# Patient Record
Sex: Male | Born: 1975 | Race: White | Hispanic: No | Marital: Married | Smoking: Current every day smoker
Health system: Southern US, Community
[De-identification: ages and names within clinical notes are randomized; demographics above are authoritative.]

---

## 2016-09-05 ENCOUNTER — Encounter (HOSPITAL_COMMUNITY): Payer: Self-pay | Admitting: Emergency Medicine

## 2016-09-05 ENCOUNTER — Emergency Department (HOSPITAL_COMMUNITY): Payer: 59

## 2016-09-05 ENCOUNTER — Emergency Department (HOSPITAL_COMMUNITY)
Admission: EM | Admit: 2016-09-05 | Discharge: 2016-09-05 | Disposition: A | Discharge status: Home / Self Care | Payer: 59 | Attending: Emergency Medicine | Admitting: Emergency Medicine

## 2016-09-05 DIAGNOSIS — F17210 Nicotine dependence, cigarettes, uncomplicated: Secondary | ICD-10-CM | POA: Insufficient documentation

## 2016-09-05 DIAGNOSIS — I10 Essential (primary) hypertension: Secondary | ICD-10-CM | POA: Diagnosis not present

## 2016-09-05 DIAGNOSIS — R51 Headache: Secondary | ICD-10-CM | POA: Diagnosis present

## 2016-09-05 DIAGNOSIS — B349 Viral infection, unspecified: Secondary | ICD-10-CM | POA: Diagnosis not present

## 2016-09-05 DIAGNOSIS — R079 Chest pain, unspecified: Secondary | ICD-10-CM

## 2016-09-05 DIAGNOSIS — R0789 Other chest pain: Secondary | ICD-10-CM

## 2016-09-05 LAB — BASIC METABOLIC PANEL
Anion gap: 7 (ref 5–15)
BUN: 9 mg/dL (ref 6–20)
CALCIUM: 9.5 mg/dL (ref 8.9–10.3)
CHLORIDE: 101 mmol/L (ref 101–111)
CO2: 30 mmol/L (ref 22–32)
CREATININE: 1.08 mg/dL (ref 0.61–1.24)
GFR calc non Af Amer: >60 mL/min (ref >60)
GLUCOSE: 93 mg/dL (ref 65–99)
Potassium: 3.8 mmol/L (ref 3.5–5.1)
Sodium: 138 mmol/L (ref 135–145)

## 2016-09-05 LAB — CBC
HCT: 50.6 % (ref 39.0–52.0)
Hemoglobin: 17.7 g/dL — ABNORMAL HIGH (ref 13.0–17.0)
MCH: 33.3 pg (ref 26.0–34.0)
MCHC: 35 g/dL (ref 30.0–36.0)
MCV: 95.3 fL (ref 78.0–100.0)
PLATELETS: 185 10*3/uL (ref 150–400)
RBC: 5.31 MIL/uL (ref 4.22–5.81)
RDW: 12.7 % (ref 11.5–15.5)
WBC: 5.9 10*3/uL (ref 4.0–10.5)

## 2016-09-05 LAB — TROPONIN I: Troponin I: <0.03 ng/mL (ref <0.03)

## 2016-09-05 MED ORDER — DILTIAZEM HCL ER 120 MG PO CP12: 120.0000 mg | ORAL_CAPSULE | Freq: Two times a day (BID) | ORAL | 1 refills | Status: AC

## 2016-09-05 MED ORDER — HYDROCHLOROTHIAZIDE 12.5 MG PO CAPS: 12.5000 mg | ORAL_CAPSULE | Freq: Every day | ORAL | 1 refills | Status: AC

## 2016-09-05 NOTE — Discharge Instructions (Signed)
Your blood pressure is elevated. Your blood pressure has been elevated during the emergency department visit. Please start Cardizem 1 daily, and hydrochlorothiazide 1 daily for now. These medications may be changed when you're seen by Dr. Reuel Boomaniel, or physicians at the adult medicine clinic. Your electrocardiogram and your troponin tests were negative for heart related emergencies. Please speak with Dr. Reuel Boomaniel about follow-up in the office, also with cardiology. You seem to have some symptoms possibly related to an upper respiratory infection. Please wash hands frequently. Please increase fluids. Please return to the emergency department if any emergent changes or problems before you're seen by Dr. Reuel Boomaniel, or the physicians at the adult medicine clinic.

## 2016-09-05 NOTE — ED Triage Notes (Signed)
Pt reports cp in L chest rad to L arm x1 month, went to drugstore last night to check bp and it was 186/123, has no hx of htn.

## 2016-09-05 NOTE — ED Provider Notes (Signed)
[facility] Provider Note   CSN: [id number] Arrival date & time: 09/05/16  1106     History   Chief Complaint Chief Complaint  Patient presents with  . Chest Pain  . Hypertension    HPI Capri Raben is a 41 y.o. male.  Patient is a 41 year old male who presents to the emergency department with a complaint of chest pain, dizziness, and headaches.  The patient states that he is really not been feeling well for 6 months or more. He states from time to time he's been having episodes of dizziness that he'll as though the room is spinning. These last about 2-3 seconds and go away, but they usually come back. The patient describes the headache that also comes and goes. The headache is not debilitating. The patient describes a chest pain that sometime goes into his left arm and wrist. He complains of some back area pain that sometimes keeps him from getting in and out of bed on. 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 The patient denies any fever, or chills. During the time of the chest discomfort he is not having sweats, and he is not having episodes of vomiting. He's not had any loss of consciousness. He states sometimes that when he does strenuous activities he feels short of breath. He states that he just does not feel like he should at age 85. It is of note that he is a smoker. He has not had any hemoptysis or any unusual weight loss. The patient has not taken any medication for these symptoms recently.   The history is provided by the patient.  Chest Pain   Associated symptoms include back pain. Pertinent negatives include no abdominal pain, no cough, no diaphoresis, no dizziness, no fever, no palpitations, no shortness of breath and no vomiting.  His past medical history is significant for hypertension.  Pertinent negatives for past medical history include no seizures.  Hypertension  Associated symptoms include chest pain.  Pertinent negatives include no abdominal pain and no shortness of breath.    History reviewed. No pertinent past medical history.  There are no active problems to display for this patient.   History reviewed. No pertinent surgical history.     Home Medications    Prior to Admission medications   Not on File    Family History History reviewed. No pertinent family history.  Social History Social History  Substance Use Topics  . Smoking status: Current Every Day Smoker    Packs/day: 2.00    Types: Cigarettes  . Smokeless tobacco: Not on file  . Alcohol use No     Allergies   Patient has no known allergies.   Review of Systems Review of Systems  Constitutional: Positive for fatigue. Negative for activity change, chills, diaphoresis and fever.       All ROS Neg except as noted in HPI  HENT: Positive for congestion. Negative for nosebleeds.   Eyes: Negative for photophobia and discharge.  Respiratory: Negative for cough, shortness of breath and wheezing.   Cardiovascular: Positive for chest pain. Negative for palpitations and leg swelling.  Gastrointestinal: Negative for abdominal pain, blood in stool, diarrhea and vomiting.  Genitourinary: Negative for dysuria, frequency and hematuria.  Musculoskeletal: Positive for back pain. Negative for arthralgias and neck pain.  Skin: Negative.   Neurological: Negative for dizziness, seizures and speech difficulty.  Psychiatric/Behavioral: Negative for confusion and hallucinations.  Physical Exam Updated Vital Signs BP (!) 158/112   Pulse 86   Temp 97.8 F (36.6 C) (Oral)   Resp 20   Ht 5\' 9"  (1.753 m)   Wt 90.7 kg   SpO2 97%   BMI 29.53 kg/m   Physical Exam  Constitutional: He is oriented to person, place, and time. He appears well-developed and well-nourished.  Non-toxic appearance.  HENT:  Head: Normocephalic.  Right Ear: Tympanic membrane and external ear normal.  Left Ear: Tympanic membrane and external  ear normal.  Nasal congestion present.  Eyes: EOM and lids are normal. Pupils are equal, round, and reactive to light.  Neck: Normal range of motion. Neck supple. Carotid bruit is not present.  No carotid bruits noted.  Cardiovascular: Normal rate, regular rhythm, normal heart sounds, intact distal pulses and normal pulses.   Pulmonary/Chest: Breath sounds normal. No respiratory distress.  Abdominal: Soft. Bowel sounds are normal. There is no tenderness. There is no guarding.  Musculoskeletal: Normal range of motion. He exhibits tenderness.       Lumbar back: He exhibits tenderness.  Lumbar pain with change of position and attempted ROM.  Lymphadenopathy:       Head (right side): No submandibular adenopathy present.       Head (left side): No submandibular adenopathy present.    He has no cervical adenopathy.  Neurological: He is alert and oriented to person, place, and time. He has normal strength. No cranial nerve deficit or sensory deficit.  Skin: Skin is warm and dry.  Psychiatric: He has a normal mood and affect. His speech is normal.  Nursing note and vitals reviewed.    ED Treatments / Results  Labs (all labs ordered are listed, but only abnormal results are displayed) Labs Reviewed  CBC - Abnormal; Notable for the following:       Result Value   Hemoglobin 17.7 (*)    All other components within normal limits  BASIC METABOLIC PANEL  TROPONIN I    EKG  EKG Interpretation  Date/Time:  Thursday September 05 2016 11:19:15 EST Ventricular Rate:  83 PR Interval:    QRS Duration: 90 QT Interval:  366 QTC Calculation: 430 R Axis:   68 Text Interpretation:  Sinus rhythm Confirmed by Adriana SimasOOK  MD, BRIAN ([id number]) on 09/05/2016 1:01:27 PM       Radiology Dg Chest 2 View  Result Date: 09/05/2016 CLINICAL DATA:  Chest pain . EXAM: CHEST  2 VIEW COMPARISON:  No recent prior . FINDINGS: Mediastinum and hilar structures normal. Heart size normal. No focal infiltrate. No pleural  effusion or pneumothorax. Thoracic spine scoliosis. IMPRESSION: No acute cardiopulmonary disease. Electronically Signed   By: Maisie Fushomas  Register   On: 09/05/2016 11:59    Procedures Procedures (including critical care time)  Medications Ordered in ED Medications - No data to display   Initial Impression / Assessment and Plan / ED Course  I have reviewed the triage vital signs and the nursing notes.  Pertinent labs & imaging results that were available during my care of the patient were reviewed by me and considered in my medical decision making (see chart for details).     **I have reviewed nursing notes, vital signs, and all appropriate lab and imaging results for this patient.*  Final Clinical Impressions(s) / ED Diagnoses  MDM Blood pressure is elevated, otherwise vital signs within normal limits. Patient complains of dizziness, chest pain, headache, and at times shortness of breath. He complains of fatigue and just  generally not feeling himself.  Basic metabolic panel was within normal limits. In particular the renal function is within normal limits. The anion gap is normal at 7. The complete blood count also returns normal. Troponin is negative for acute event. There no life-threatening arrhythmias or events noted on the electrocardiogram. Chest x-ray shows no acute cardiopulmonary disease.  Pt will be placed on cardizem 120 and HCTZ 12.5. Pt to have close out patient follow up. Discussed the dangers of untreated hypertension with the patient. He is in agreement with plans for follow up and medications.   Final diagnoses:  Essential hypertension  Viral illness  Chest pain of uncertain etiology    New Prescriptions Discharge Medication List as of 09/05/2016  2:08 PM    START taking these medications   Details  diltiazem (CARDIZEM SR) 120 MG 12 hr capsule Take 1 capsule (120 mg total) by mouth 2 (two) times daily., Starting Thu 09/05/2016, Print    hydrochlorothiazide  (MICROZIDE) 12.5 MG capsule Take 1 capsule (12.5 mg total) by mouth daily., Starting Thu 09/05/2016, Print         [place], PA-C 09/07/16 1610    Donnetta Hutching, MD 09/09/16 [phone]

## 2017-09-09 DIAGNOSIS — N529 Male erectile dysfunction, unspecified: Secondary | ICD-10-CM | POA: Diagnosis not present

## 2017-09-09 DIAGNOSIS — K219 Gastro-esophageal reflux disease without esophagitis: Secondary | ICD-10-CM | POA: Diagnosis not present

## 2017-09-09 DIAGNOSIS — Z720 Tobacco use: Secondary | ICD-10-CM | POA: Diagnosis not present

## 2017-09-09 DIAGNOSIS — I10 Essential (primary) hypertension: Secondary | ICD-10-CM | POA: Diagnosis not present

## 2018-06-16 IMAGING — DX DG CHEST 2V
2 series · 2 of 2 positions shown · non-contrast
Comparison: No recent prior .

CLINICAL DATA: Chest pain .

EXAM:
CHEST  2 VIEW

[chest pa]
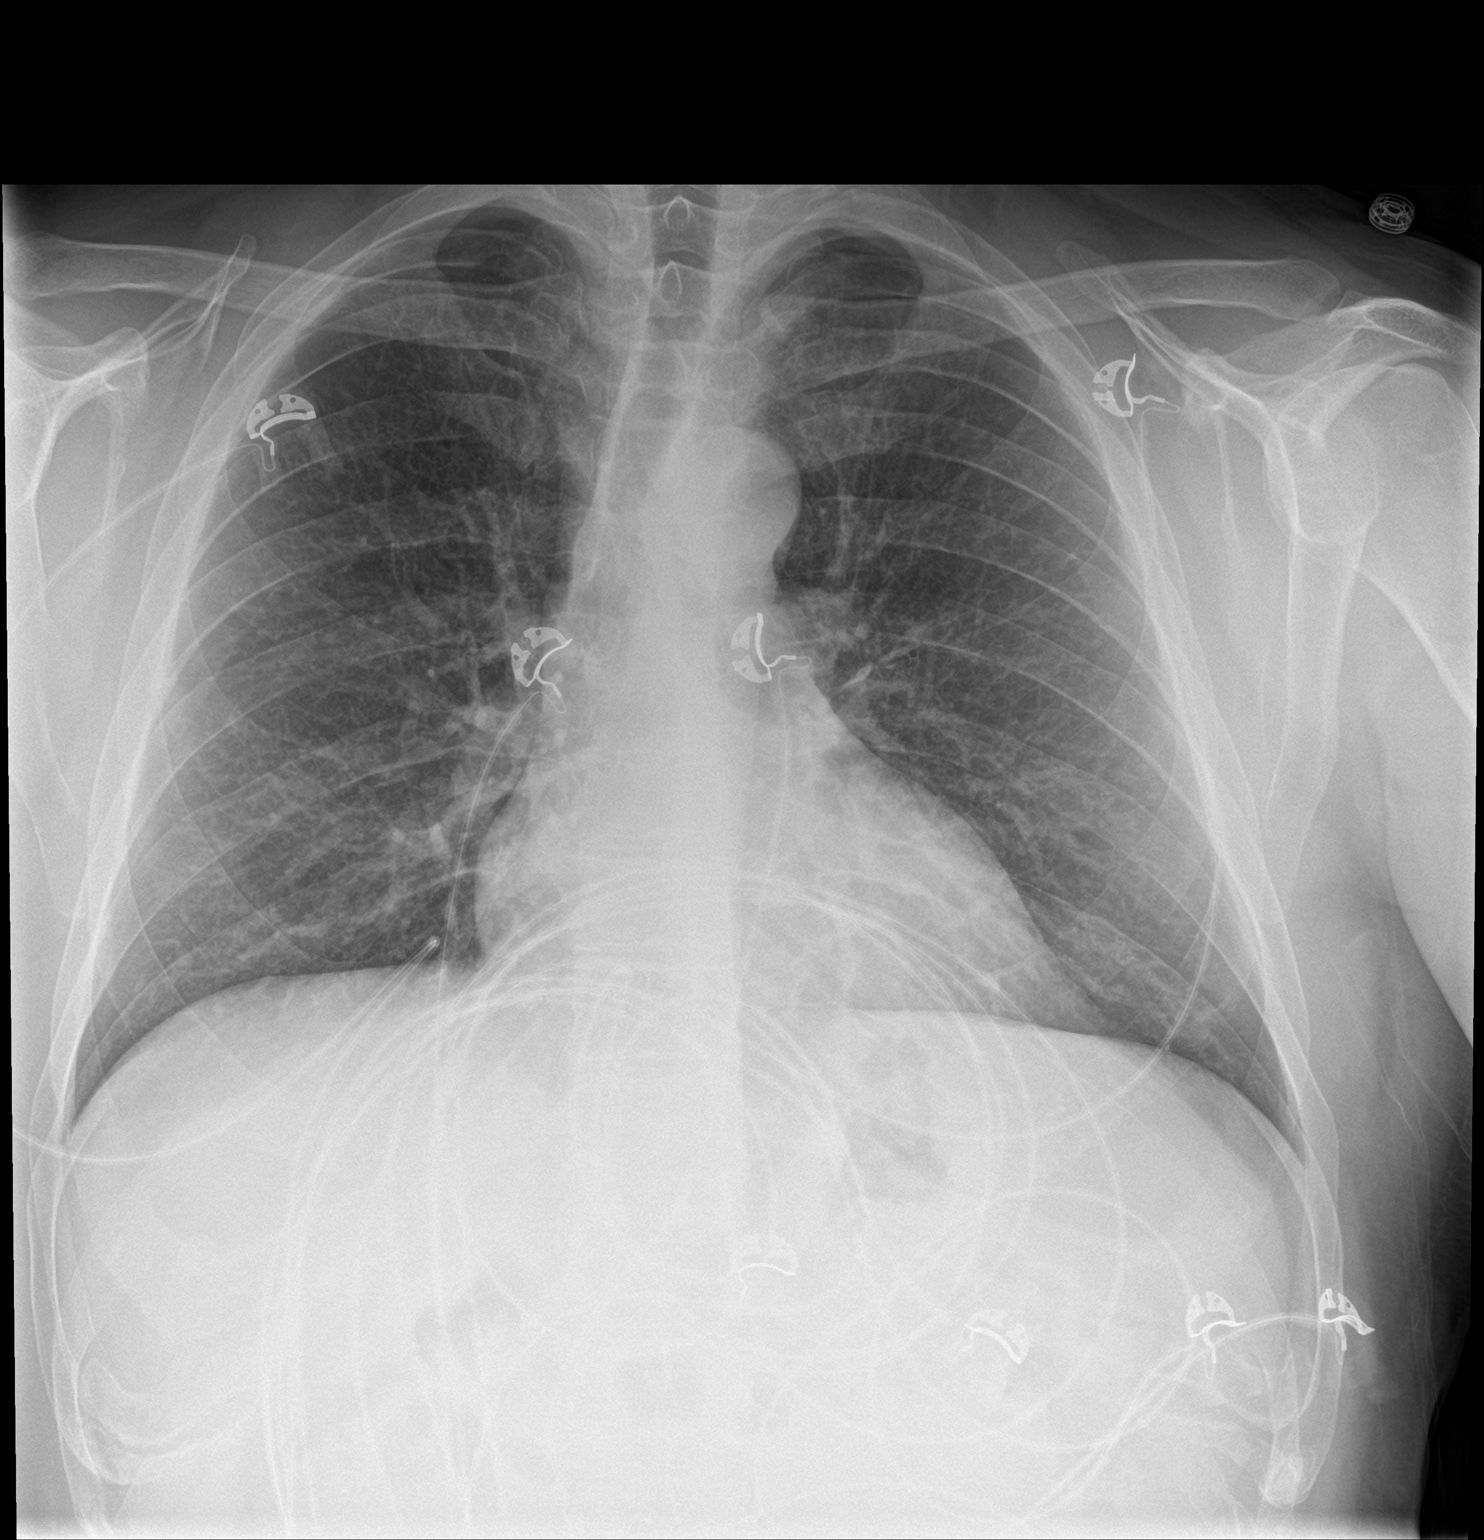

[chest lat]
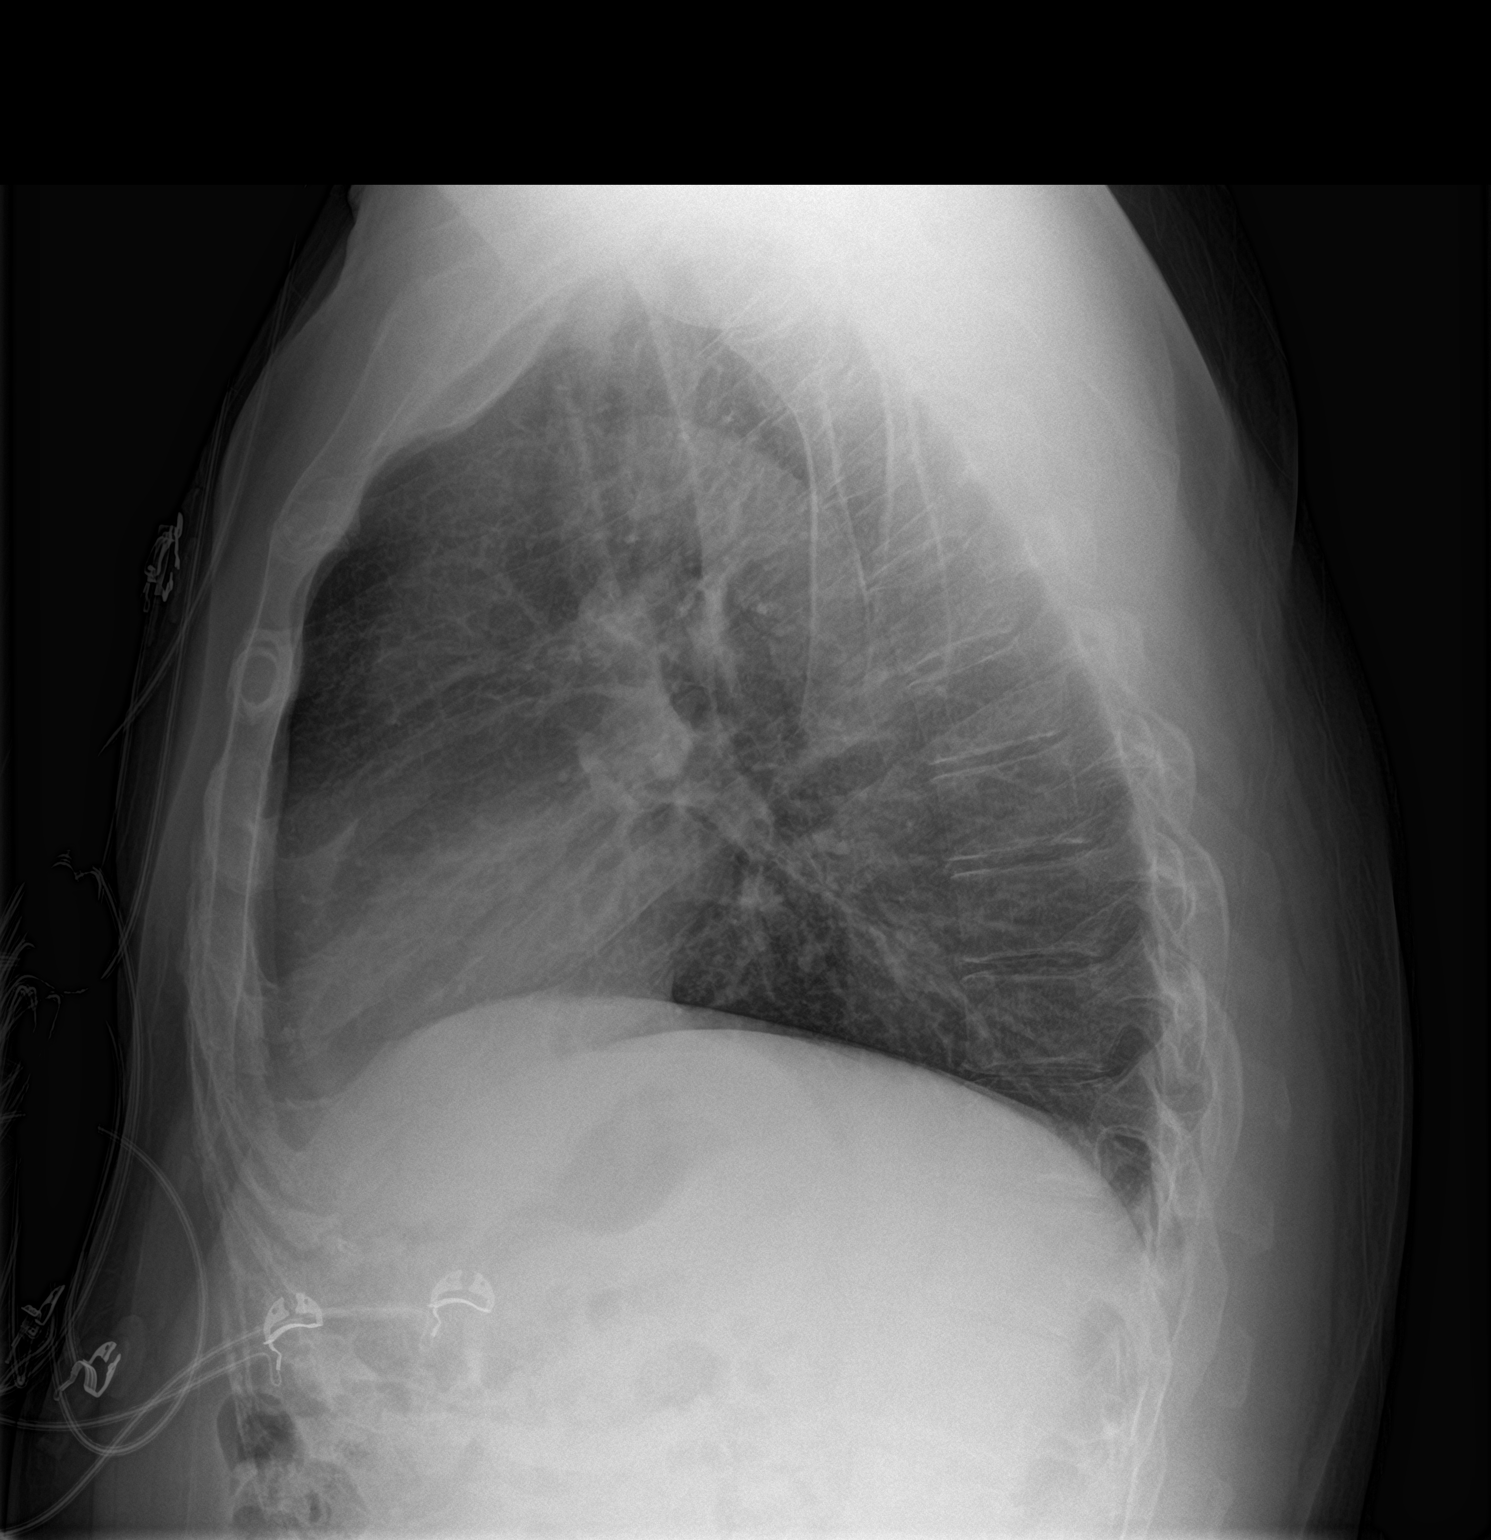

[2 of 2 positions shown; findings below may reference images not displayed]

FINDINGS: Mediastinum and hilar structures normal. Heart size normal. No focal
infiltrate. No pleural effusion or pneumothorax. Thoracic spine
scoliosis.
IMPRESSION: No acute cardiopulmonary disease.

## 2020-04-22 DIAGNOSIS — Z6828 Body mass index (BMI) 28.0-28.9, adult: Secondary | ICD-10-CM | POA: Diagnosis not present

## 2020-04-22 DIAGNOSIS — K219 Gastro-esophageal reflux disease without esophagitis: Secondary | ICD-10-CM | POA: Diagnosis not present

## 2020-04-22 DIAGNOSIS — I10 Essential (primary) hypertension: Secondary | ICD-10-CM | POA: Diagnosis not present

## 2020-04-22 DIAGNOSIS — Z720 Tobacco use: Secondary | ICD-10-CM | POA: Diagnosis not present

## 2020-04-22 DIAGNOSIS — F411 Generalized anxiety disorder: Secondary | ICD-10-CM | POA: Diagnosis not present
